# Patient Record
Sex: Male | Born: 2000 | Race: White | Hispanic: No | Marital: Single | Smoking: Never smoker
Health system: Southern US, Community
[De-identification: ages and names within clinical notes are randomized; demographics above are authoritative.]

## PROBLEM LIST (undated history)

## (undated) DIAGNOSIS — T783XXA Angioneurotic edema, initial encounter: Secondary | ICD-10-CM

## (undated) DIAGNOSIS — I880 Nonspecific mesenteric lymphadenitis: Secondary | ICD-10-CM

## (undated) DIAGNOSIS — J45909 Unspecified asthma, uncomplicated: Secondary | ICD-10-CM

## (undated) DIAGNOSIS — Z8489 Family history of other specified conditions: Secondary | ICD-10-CM

## (undated) DIAGNOSIS — F909 Attention-deficit hyperactivity disorder, unspecified type: Secondary | ICD-10-CM

## (undated) DIAGNOSIS — J302 Other seasonal allergic rhinitis: Secondary | ICD-10-CM

## (undated) DIAGNOSIS — L509 Urticaria, unspecified: Secondary | ICD-10-CM

## (undated) HISTORY — DX: Urticaria, unspecified: L50.9

## (undated) HISTORY — DX: Nonspecific mesenteric lymphadenitis: I88.0

## (undated) HISTORY — DX: Angioneurotic edema, initial encounter: T78.3XXA

## (undated) HISTORY — PX: NO PAST SURGERIES: SHX2092

---
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
## 2020-01-14 ENCOUNTER — Telehealth: Payer: Self-pay

## 2020-01-14 NOTE — Telephone Encounter (Signed)
[facility] immunization report printed and patient aware it is at front desk to pick up.

## 2020-01-14 NOTE — Telephone Encounter (Signed)
Patient needs immunization records for college. Orientation is on 01/17/20 so he will need them before Thursday. Please call when ready for pick up.

## 2020-01-29 DIAGNOSIS — J329 Chronic sinusitis, unspecified: Secondary | ICD-10-CM | POA: Insufficient documentation

## 2020-02-06 MED ORDER — ALBUTEROL SULFATE HFA 108 (90 BASE) MCG/ACT IN AERS: 2.0000 | INHALATION_SPRAY | Freq: Four times a day (QID) | RESPIRATORY_TRACT | 1 refills | Status: DC | PRN

## 2020-02-06 NOTE — Addendum Note (Signed)
Addended by: Alfonse Spruce on: 02/06/2020 02:41 PM   Modules accepted: Orders

## 2020-02-06 NOTE — Telephone Encounter (Signed)
Albuterol refill sent in.   Malachi Bonds, MD [facility]

## 2020-02-26 ENCOUNTER — Telehealth: Payer: Self-pay | Admitting: Allergy & Immunology

## 2020-02-26 MED ORDER — AMOXICILLIN 875 MG PO TABS: 875.0000 mg | ORAL_TABLET | Freq: Two times a day (BID) | ORAL | 0 refills | Status: AC

## 2020-02-26 NOTE — Telephone Encounter (Signed)
Patient's mother called to report that he was having 4 to 6 days of worsening bilateral sinus pressure with purulent discharge.  He has been using Sudafed with minimal relief.  He actually is using his nasal sprays as well, which is bizarre for him.  Regardless, she is wondering whether he needs any antibiotics called in.  He is away at college.  I will go ahead and send in a course of amoxicillin for him.  Mom is also considering getting allergy shots started.  Malachi Bonds, MD [facility]

## 2020-04-23 ENCOUNTER — Encounter: Payer: Self-pay | Admitting: Family Medicine

## 2020-04-23 ENCOUNTER — Other Ambulatory Visit: Payer: Self-pay

## 2020-04-23 ENCOUNTER — Telehealth (INDEPENDENT_AMBULATORY_CARE_PROVIDER_SITE_OTHER): Payer: 59 | Admitting: Family Medicine

## 2020-04-23 VITALS — Temp 98.3°F

## 2020-04-23 DIAGNOSIS — J029 Acute pharyngitis, unspecified: Secondary | ICD-10-CM | POA: Diagnosis not present

## 2020-04-23 MED ORDER — AMOXICILLIN-POT CLAVULANATE 875-125 MG PO TABS: 1.0000 | ORAL_TABLET | Freq: Two times a day (BID) | ORAL | 0 refills | Status: DC

## 2020-04-23 NOTE — Progress Notes (Signed)
Virtual Visit via Video Note  I connected with pt on 04/23/20 at  4:00 PM EDT by a video enabled telemedicine application and verified that I am speaking with the correct person using two identifiers.  Location patient: home Location provider:work or home office Persons participating in the virtual visit: patient, provider  I discussed the limitations of evaluation and management by telemedicine and the availability of in person appointments. The patient expressed understanding and agreed to proceed.  Telemedicine visit is a necessity given the COVID-19 restrictions in place at the current time.  HPI: 19 y/o WM with hx of allergic rhinitis and asthma who is being seen today (accompanied by his mom) for sore throat and headache. Onset 2 d/a, progressive ST, very tired.  +HA. No fever. No runny nose but some stuffy nose but this is not unusual for him, no cough, no SOB, no wheezing. Sore to palpation on neck but no nodules are felt by pt palpation today. Some nausea.  Advil since last night, unclear if helping.  No abd pain, diarrhea,or rash.  Covid 19 vaccine status: UTD. Flu : not yet.  ROS: See pertinent positives and negatives per HPI.  Past Medical History:  Diagnosis Date  . ADHD (attention deficit hyperactivity disorder)   . Angio-edema   . Asthma   . Family history of adverse reaction to anesthesia    mom had am asthma flare up after waking up.   . Fetomaternal hemorrhage 2000-11-05   41 wks term, fetomaternal hemmorhage s/p transfusion in NICU  . Mesenteric adenitis   . Mononucleosis 03/2018  . Seasonal allergies   . Urticaria     Past Surgical History:  Procedure Laterality Date  . CHOLECYSTECTOMY N/A 07/12/2019   Procedure: LAPAROSCOPIC CHOLECYSTECTOMY WITH ATTEMPTED INTRAOPERATIVE CHOLANGIOGRAM;  Surgeon: [name], MD;  Location: [facility] OR;  Service: General;  Laterality: N/A;     Current Outpatient Medications:  .  albuterol (VENTOLIN HFA) 108 (90 Base)  MCG/ACT inhaler, Inhale 2 puffs into the lungs every 6 (six) hours as needed for wheezing or shortness of breath., Disp: 18 g, Rfl: 1 .  EPINEPHrine (AUVI-Q) 0.3 mg/0.3 mL IJ SOAJ injection, Inject 0.3 mLs (0.3 mg total) into the muscle as needed for anaphylaxis., Disp: 2 Device, Rfl: 2 .  Fluticasone Propionate (XHANCE) 93 MCG/ACT EXHU, Place 2 sprays into the nose 2 (two) times daily as needed., Disp: 32 mL, Rfl: 5 .  Melatonin 3 MG CAPS, Take 3 mg by mouth at bedtime as needed (sleep)., Disp: , Rfl:  .  Olopatadine HCl (PATADAY) 0.2 % SOLN, Place 1 drop into both eyes daily as needed., Disp: 2.5 mL, Rfl: 5 .  pantoprazole (PROTONIX) 20 MG tablet, Take 2 tablets (40 mg total) by mouth daily for 14 days., Disp: 28 tablet, Rfl: 0 .  predniSONE (DELTASONE) 10 MG tablet, Take two tablets (20mg ) twice daily for three days, then one tablet (10mg ) twice daily for three days, then STOP., Disp: 18 tablet, Rfl: 0 .  sertraline (ZOLOFT) 50 MG tablet, Take 1.5 tablets (75 mg total) by mouth daily., Disp: 135 tablet, Rfl: 1 .  Timothy Grass Pollen Allergen (GRASTEK) 2800 BAU SUBL, Place 1 tablet under the tongue daily. (Patient taking differently: Place 1 tablet under the tongue daily as needed. ), Disp: 30 tablet, Rfl: 2  EXAM:  VITALS per patient if applicable:  Vitals with BMI 10/09/2019 09/24/2019 07/16/2019  Height - 5' 9.75" 5' 9.75"  Weight - - 133 lbs 4 oz  BMI - -  19.25  Systolic (No Data) - 111  Diastolic (No Data) - 74  Pulse - - 82   GENERAL: alert, oriented, appears well and in no acute distress  HEENT: atraumatic, conjunttiva clear, no obvious abnormalities on inspection of external nose and ears  NECK: normal movements of the head and neck  LUNGS: on inspection no signs of respiratory distress, breathing rate appears normal, no obvious gross SOB, gasping or wheezing  CV: no obvious cyanosis  MS: moves all visible extremities without noticeable abnormality  PSYCH/NEURO: pleasant and  cooperative, no obvious depression or anxiety, speech and thought processing grossly intact  ASSESSMENT AND PLAN:  Discussed the following assessment and plan:  Acute pharyngitis, strep possible.  Also consider flu and/or covid, as well as other common respiratory viruses. I'll rx augmentin 875-125, 1 bid x 10d. Check strep/flu/covid swabs tomorrow afternoon at hour office. Cont advil 600 mg tid with food. Push fluids, rest. Signs/symptoms to call or return for were reviewed and pt expressed understanding.  -we discussed possible serious and likely etiologies, options for evaluation and workup, limitations of telemedicine visit vs in person visit, treatment, treatment risks and precautions. Pt prefers to treat via telemedicine empirically rather than in person at this moment.    I discussed the assessment and treatment plan with the patient. The patient was provided an opportunity to ask questions and all were answered. The patient agreed with the plan and demonstrated an understanding of the instructions.   Signed:  Santiago Bumpers, MD           04/23/2020

## 2020-04-24 ENCOUNTER — Encounter: Payer: Self-pay | Admitting: Family Medicine

## 2020-04-24 ENCOUNTER — Ambulatory Visit (INDEPENDENT_AMBULATORY_CARE_PROVIDER_SITE_OTHER): Payer: 59

## 2020-04-24 DIAGNOSIS — R6889 Other general symptoms and signs: Secondary | ICD-10-CM

## 2020-04-24 LAB — POCT INFLUENZA A/B
Influenza A, POC: NEGATIVE
Influenza B, POC: NEGATIVE

## 2020-04-24 LAB — POCT RAPID STREP A (OFFICE): Rapid Strep A Screen: NEGATIVE

## 2020-04-24 MED ORDER — PROMETHAZINE HCL 12.5 MG PO TABS: ORAL_TABLET | ORAL | 0 refills | Status: DC

## 2020-04-24 NOTE — Telephone Encounter (Signed)
OK, will eRx phenergan.  If not responding to this med this evening and pt continues to worsen then needs to be seen in ED. Also can put on my schedule tomorrow at 11:30 now if he/mom wants.

## 2020-04-26 LAB — SARS-COV-2, NAA 2 DAY TAT

## 2020-04-26 LAB — NOVEL CORONAVIRUS, NAA: SARS-CoV-2, NAA: NOT DETECTED

## 2020-04-28 ENCOUNTER — Telehealth: Payer: Self-pay | Admitting: Family Medicine

## 2020-04-28 NOTE — Telephone Encounter (Signed)
Patient's mother called back regarding followup testing. She will be available to take a call before 1150 this morning or after 2pm this afternoon.

## 2020-04-28 NOTE — Telephone Encounter (Signed)
Patient's mother advised regarding results.

## 2020-05-25 ENCOUNTER — Telehealth: Payer: Self-pay | Admitting: Allergy & Immunology

## 2020-05-25 MED ORDER — ALBUTEROL SULFATE HFA 108 (90 BASE) MCG/ACT IN AERS: 2.0000 | INHALATION_SPRAY | Freq: Four times a day (QID) | RESPIRATORY_TRACT | 1 refills | Status: DC | PRN

## 2020-05-25 NOTE — Telephone Encounter (Signed)
Patient's mother called requesting albuterol be sent to where his dad lives [place], [place]) since [place] left his albuterol inhaler in [place] with his mother. Confirmed pharmacy and sent in the prescription.   Malachi Bonds, MD [facility]

## 2020-06-11 ENCOUNTER — Other Ambulatory Visit: Payer: Self-pay

## 2020-06-11 MED ORDER — SERTRALINE HCL 50 MG PO TABS: 75.0000 mg | ORAL_TABLET | Freq: Every day | ORAL | 0 refills | Status: DC

## 2020-07-03 ENCOUNTER — Other Ambulatory Visit: Payer: Self-pay | Admitting: Family Medicine

## 2020-07-15 ENCOUNTER — Ambulatory Visit: Payer: 59 | Admitting: Family Medicine

## 2020-07-15 ENCOUNTER — Other Ambulatory Visit: Payer: Self-pay

## 2020-07-16 ENCOUNTER — Ambulatory Visit: Payer: 59 | Admitting: Family Medicine

## 2020-07-16 ENCOUNTER — Encounter: Payer: Self-pay | Admitting: Family Medicine

## 2020-07-16 VITALS — BP 108/61 | HR 75 | Temp 97.3°F | Ht 69.75 in | Wt 168.0 lb

## 2020-07-16 DIAGNOSIS — F418 Other specified anxiety disorders: Secondary | ICD-10-CM | POA: Diagnosis not present

## 2020-07-16 MED ORDER — SERTRALINE HCL 50 MG PO TABS: 75.0000 mg | ORAL_TABLET | Freq: Every day | ORAL | 1 refills | Status: DC

## 2020-07-16 NOTE — Progress Notes (Signed)
Patient ID: Adam Odonnell, male  DOB: [birth year], 20 y.o.   MRN: [id number] Patient Care Team    Relationship Specialty Notifications Start End  Ma Hillock, DO PCP - General Family Medicine  07/16/19   Valentina Shaggy, MD Consulting Physician Allergy and Immunology  07/16/19   Arbutus Leas, PhD Consulting Physician Psychology  07/16/19     Chief Complaint  Patient presents with  . Follow-up    [facility]    Subjective: Adam Odonnell is a 20 y.o.  male present for follow-up on depression and anxiety. Depression/anxiety: Patient reports compliance Zoloft 75 mg daily.  He feels its working well for him. It was increased last visit.  He does not want to go up on his medication too terribly much.  He is no longer seeing his therapist.  He left college and moved back home.  He did not feel the college was a good fit for him.  He is currently working at [employer] part-time and is planning on starting [employer]. Prior note: Patient presents with his mother today to discuss feelings of depression and anxiety that started approximately 3 months ago.  Mother states his symptoms started when he was positive for Covid.  She reports he had to stay in his room and isolate, since that time he has started to experience depression and anxiety.  He states he has lack of motivation for all tasks including schoolwork.  He is either sleeping too much and then not sleeping well.  He is not eating well.  He is having trouble concentrating.  He has noticed an increase in his worrying.  There is a family history of depression and anxiety.  Mother reports he had a full work-up at his pediatrics office that included a TSH just a few weeks ago and it was normal.  Depression screen [facility] 2/9 07/16/2020 09/24/2019 07/16/2019  Decreased Interest 1 0 3  Down, Depressed, Hopeless 1 0 3  PHQ - 2 Score 2 0 6  Altered sleeping '1 1 3  ' Tired, decreased energy '1 1 2  ' Change in appetite 0 0 3  Feeling bad or  failure about yourself  0 0 1  Trouble concentrating 0 1 3  Moving slowly or fidgety/restless 0 0 0  Suicidal thoughts 0 0 0  PHQ-9 Score '4 3 18  ' Difficult doing work/chores - Somewhat difficult Not difficult at all   GAD 7 : Generalized Anxiety Score 07/16/2020 09/24/2019 07/16/2019  Nervous, Anxious, on Edge '1 1 2  ' Control/stop worrying 0 1 2  Worry too much - different things '1 3 3  ' Trouble relaxing '1 1 3  ' Restless 0 0 0  Easily annoyed or irritable '1 3 3  ' Afraid - awful might happen 0 0 0  Total GAD 7 Score '4 9 13  ' Anxiety Difficulty - Very difficult Somewhat difficult       No flowsheet data found.   Immunization History  Administered Date(s) Administered  . DTaP 01/23/2001, 04/06/2001, 06/05/2001, 02/28/2002, 12/07/2005  . HPV Quadrivalent 12/24/2011, 09/27/2012, 02/13/2013  . Hepatitis A 12/07/2005, 11/18/2006  . Hepatitis B [birth year]01, 12/19/2000, 09/18/2001  . HiB (PRP-OMP) 01/23/2001, 04/06/2001, 06/05/2001, 02/28/2002  . IPV 01/23/2001, 04/06/2001, 11/22/2001, 12/07/2005  . Influenza,inj,Quad PF,6+ Mos 04/30/2019  . MMR 11/22/2001, 12/07/2005  . Meningococcal Conjugate 12/24/2011  . Pneumococcal Conjugate-13 01/23/2001, 04/06/2001, 06/05/2001, 02/28/2002  . Tdap 12/24/2011  . Varicella 11/22/2001, 12/07/2005    No exam data present  Past Medical History:  Diagnosis Date  . ADHD (attention deficit hyperactivity disorder)   . Angio-edema   . Asthma   . Family history of adverse reaction to anesthesia    mom had am asthma flare up after waking up.   . Fetomaternal hemorrhage 2001-05-08   41 wks term, fetomaternal hemmorhage s/p transfusion in NICU  . Mesenteric adenitis   . Mononucleosis 03/2018  . Seasonal allergies   . Urticaria    No Known Allergies Past Surgical History:  Procedure Laterality Date  . CHOLECYSTECTOMY N/A 07/12/2019   Procedure: LAPAROSCOPIC CHOLECYSTECTOMY WITH ATTEMPTED INTRAOPERATIVE CHOLANGIOGRAM;  Surgeon: Coralie Keens, MD;   Location: [facility] OR;  Service: General;  Laterality: N/A;   Family History  Problem Relation Age of Onset  . Allergic rhinitis Mother   . Asthma Mother   . Cancer Mother   . Depression Mother   . Alcohol abuse Father   . Asthma Maternal Grandmother   . Depression Maternal Grandmother   . Hypertension Maternal Grandmother   . Heart disease Maternal Grandfather   . Colon cancer Paternal Grandmother   . Alcohol abuse Paternal Grandfather   . 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    Safety:      -smoke alarm in the home:Yes     - wears seatbelt: Yes     - Feels safe in their relationships: Yes       All past medical history, surgical history, allergies, family history, immunizations andmedications were updated in the EMR today and reviewed under the history and medication portions of their EMR.     No results found.   ROS: 14 pt review of systems performed and negative (unless mentioned in an HPI)  Objective: BP 108/61   Pulse 75   Temp (!) 97.3 F (36.3 C) (Oral)   Ht 5' 9.75" (1.772 m)   Wt 168 lb (76.2 kg)   SpO2 96%   BMI 24.28 kg/m  Gen: Afebrile. No acute distress.  HENT: AT. [place].  Eyes:Pupils Equal Round Reactive to light, Extraocular movements intact,  Conjunctiva without redness, discharge or icterus. Neuro: Normal gait. PERLA. EOMi. Alert. Oriented x3 Psych: Normal affect, dress and demeanor. Normal speech. Normal thought content and judgment.  Assessment/plan: Adam Odonnell is a 20 y.o. male present for est care Depression with anxiety Stable.  - continue  Zoloft  75 mg daily. Discussed staying motivated.  Maintaining routine exercise. No longer attending therapy sessions.  Mom has some concerns he is sleeping more and is unmotivated. Follow-up in 5.5 mos sooner if needed   No orders of the defined types were placed in this  encounter.  Meds ordered this encounter  Medications  . sertraline (ZOLOFT) 50 MG tablet    Sig: Take 1.5 tablets (75 mg total) by mouth daily.    Dispense:  135 tablet    Refill:  1     Note is dictated utilizing voice recognition software. Although note has been proof read prior to signing, occasional typographical errors still can be missed. If any questions arise, please do not hesitate to call for verification.  Electronically signed by: Howard Pouch, DO [place]

## 2020-07-16 NOTE — Patient Instructions (Signed)
Nice to see you today.  I have refilled your medications for you.  Next appt first week in July- unless needed sooner.     Managing Depression, Teen Depression is a mental health condition that can affect your thoughts, feelings, and behavior. You may feel down, blue, or sad, or you may be irritable and moody. If you have been diagnosed with depression, you may be relieved to know now why you have felt or behaved a certain way. If you are living with depression, there are ways to help you relieve the symptoms and feel better. How to manage lifestyle changes Managing stress Stress is your body's reaction to life's demands. You can have stress from good things, such as a vacation, or difficult things, such as a hard test. Stress that lasts a long time can play a part in depression, so it is important to learn how to manage stress. Try some of the following approaches for reducing your tension and helping to manage stress (stress reduction techniques):  If you play an instrument, take some time to play it, or listen to music that helps you feel calm.  Try using a meditation app.  Do some deep breathing. To do this, inhale slowly through your nose. Pause at the top of your inhale for a few seconds and then exhale slowly, letting yourself relax. Repeat this three or four times. There are several other things you can do to help you manage depression, such as:  Spending time in nature.  Spending time with trusted friends who help you feel better.  Taking time to think about the positive things in your life.  Exercising, such as playing an active game with friends or going for a run or a bike ride.  Spending less time using electronics, especially at night before bed. Using electronic screens before bed makes your brain think it is time to get up rather than go to bed.  Limit how much time you watch TV or play video games. These activities might feel good for a while, but in the end, they are a  way to avoid the feelings of depression. Medicines Antidepressants are often prescribed by a health care provider to ease the symptoms of depression. When used together, medicines, psychotherapy, and stress reduction techniques are often the most effective treatment. Medicines take time to work. You may not notice the full benefits of your medicine for 4-8 weeks.  Do not stop taking your medicine. Talk to your health care provider and have a plan to lower your dose safely. Relationships Relationships are important to people throughout their lives. Friends and family can be great resources to help you deal with the difficult feelings you get from depression. Talk to family and friends when things are becoming difficult. You may also want to talk with a therapist. A relationship with a therapist may be very important to helping you manage your depression.   How to recognize changes Everyone responds differently to treatment for depression. Recovery from depression happens when your symptoms have gone away and you may:  Have more interest in doing activities.  Feel hopeful again.  Have more energy.  Have fewer problems with eating too much or too little food.  Have better mental focus. If you find your depression does not change, you may still:  Have problems sleeping or waking, feel tired all the time, or have trouble focusing.  Have changes in your appetite. You may lose or gain weight without trying.  Have constant headaches  or stomachaches.  Want to be alone or avoid interacting with others.  Lack interest in doing the things you usually like to do.  Feel angry or irritated most of the time.  Think about death, or consider suicide.  Use alcohol, drugs, or tobacco or nicotine products. Follow these instructions at home: Activity  Spend time with trusted friends who help you feel better.  Get some form of activity each day, such as walking, biking, or any movement activity  you enjoy.  Practice self-calming and other stress reduction techniques.   Lifestyle  Get the right amount and quality of sleep.  Do not use drugs. Do not drink alcohol.  Eat a healthy diet that includes plenty of vegetables, fruits, whole grains, low-fat dairy products, and lean protein. Do not eat a lot of foods that are high in solid fats, added sugars, or salt (sodium). General instructions  Take over-the-counter and prescription medicines only as told by your health care provider. Tell your health care provider about the positive and negative effects you are having from your medicines.  Keep all follow-up visits as told by your health care provider. This is important. Where to find support Talking to others Although depression is serious, support is available. Resources may include:  Suicide prevention, crisis prevention, and depression hotlines.  School teachers, counselors, Systems developer, or clergy.  Parents or other family members.  Trusted friends.  Support groups. Therapy and support groups You can locate a counselor or support group from one of these sources:  Anxiety and Depression Association of America (ADAA): www.adaa.org  Mental Health America: www.mentalhealthamerica.net  [employer] on Mental Illness (NAMI): www.nami.org Contact a health care provider if:  You stop taking your antidepressant medicines, and you have any of these symptoms: ? Nausea. ? Headache. ? Light-headedness. ? Chills and body aches. ? Not being able to sleep (insomnia).  You or your friends and family think your depression is getting worse. Get help right away if:  You feel suicidal and are planning suicide.  You are drinking or using drugs excessively.  You are cutting yourself or thinking about it.  You are thinking about hurting others and are making a plan to do so. If you ever feel like you may hurt yourself or others, or have thoughts about taking your own life, get  help right away. Go to your nearest emergency department or:  Call your local emergency services (911 in the [place]  Call a suicide crisis helpline, such as the National Suicide Prevention Lifeline at (917)679-8343. This is open 24 hours a day in the [place]  Text the Crisis Text Line at 757 206 8866 (in the [place] Summary  There are ways to help you relieve your symptoms of depression.  Work with your health care provider on a care plan that includes stress reduction techniques, medicines (if applicable), therapy, and healthy lifestyle habits.  A relationship with a therapist may be very important to helping you manage your depression.  If you have thoughts about taking your own life, call a suicide crisis helpline or text a crisis text line. This information is not intended to replace advice given to you by your health care provider. Make sure you discuss any questions you have with your health care provider. Document Revised: 04/25/2019 Document Reviewed: 04/25/2019 Elsevier Patient Education  2021 [employer].

## 2020-11-26 ENCOUNTER — Ambulatory Visit: Payer: 59 | Admitting: Family Medicine

## 2020-12-09 ENCOUNTER — Ambulatory Visit (INDEPENDENT_AMBULATORY_CARE_PROVIDER_SITE_OTHER): Payer: 59 | Admitting: Family Medicine

## 2020-12-09 ENCOUNTER — Other Ambulatory Visit: Payer: Self-pay

## 2020-12-09 ENCOUNTER — Encounter: Payer: Self-pay | Admitting: Family Medicine

## 2020-12-09 VITALS — BP 118/75 | HR 58 | Temp 98.4°F | Ht 70.0 in | Wt 136.0 lb

## 2020-12-09 DIAGNOSIS — F418 Other specified anxiety disorders: Secondary | ICD-10-CM | POA: Diagnosis not present

## 2020-12-09 DIAGNOSIS — K8020 Calculus of gallbladder without cholecystitis without obstruction: Secondary | ICD-10-CM | POA: Insufficient documentation

## 2020-12-09 MED ORDER — SERTRALINE HCL 100 MG PO TABS: 100.0000 mg | ORAL_TABLET | Freq: Every day | ORAL | 1 refills | Status: DC

## 2020-12-09 NOTE — Progress Notes (Signed)
Patient ID: Adam Odonnell, male  DOB: 2000-10-24, 20 y.o.   MRN: [id number] Patient Care Team    Relationship Specialty Notifications Start End  Ma Hillock, DO PCP - General Family Medicine  07/16/19   Valentina Shaggy, MD Consulting Physician Allergy and Immunology  07/16/19   Arbutus Leas, PhD Consulting Physician Psychology  07/16/19     Chief Complaint  Patient presents with   Anxiety    Subjective: Adam Odonnell is a 20 y.o.  male present for follow-up on depression and anxiety. Depression/anxiety: Patient reports compliance Zoloft 75 mg daily.  He feels this medication works okay for him but he has noticed an increase in anxiety and thinks he could use a higher dose.  He left college and moved back home.  He did not feel the college was a good fit for him.  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 Prior note: Patient presents with his mother today to discuss feelings of depression and anxiety that started approximately 3 months ago.  Mother states his symptoms started when he was positive for Covid.  She reports he had to stay in his room and isolate, since that time he has started to experience depression and anxiety.  He states he has lack of motivation for all tasks including schoolwork.  He is either sleeping too much and then not sleeping well.  He is not eating well.  He is having trouble concentrating.  He has noticed an increase in his worrying.  There is a family history of depression and anxiety.  Mother reports he had a full work-up at his pediatrics office that included a TSH just a few weeks ago and it was normal.  Depression screen [facility] 2/9 12/09/2020 07/16/2020 09/24/2019 07/16/2019  Decreased Interest 1 1 0 3  Down, Depressed, Hopeless 1 1 0 3  PHQ - 2 Score 2 2 0 6  Altered sleeping '1 1 1 3  ' Tired, decreased energy '1 1 1 2  ' Change in appetite 2 0 0 3  Feeling bad or failure about yourself   1 0 0 1  Trouble concentrating 1 0 1 3  Moving slowly or fidgety/restless 1 0 0 0  Suicidal thoughts 0 0 0 0  PHQ-9 Score '9 4 3 18  ' Difficult doing work/chores - - Somewhat difficult Not difficult at all   GAD 7 : Generalized Anxiety Score 12/09/2020 07/16/2020 09/24/2019 07/16/2019  Nervous, Anxious, on Edge '2 1 1 2  ' Control/stop worrying 1 0 1 2  Worry too much - different things '1 1 3 3  ' Trouble relaxing '1 1 1 3  ' Restless 1 0 0 0  Easily annoyed or irritable '2 1 3 3  ' Afraid - awful might happen 0 0 0 0  Total GAD 7 Score '8 4 9 13  ' Anxiety Difficulty - - Very difficult Somewhat difficult       No flowsheet data found.   Immunization History  Administered Date(s) Administered   DTaP 01/23/2001, 04/06/2001, 06/05/2001, 02/28/2002, 12/07/2005   HPV Quadrivalent 12/24/2011, 09/27/2012, 02/13/2013   Hepatitis A 12/07/2005, 11/18/2006   Hepatitis B Dec 27, 2000, 12/19/2000, 09/18/2001   HiB (PRP-OMP) 01/23/2001, 04/06/2001, 06/05/2001, 02/28/2002   IPV 01/23/2001, 04/06/2001, 11/22/2001, 12/07/2005   Influenza,inj,Quad PF,6+ Mos 04/30/2019   MMR 11/22/2001, 12/07/2005   Meningococcal Conjugate 12/24/2011   PFIZER Comirnaty(Gray Top)Covid-19 Tri-Sucrose Vaccine 12/26/2019, 01/26/2020   Pneumococcal Conjugate-13 01/23/2001, 04/06/2001, 06/05/2001, 02/28/2002  Tdap 12/24/2011   Varicella 11/22/2001, 12/07/2005    No results found.  Past Medical History:  Diagnosis Date   ADHD (attention deficit hyperactivity disorder)    Angio-edema    Asthma    Family history of adverse reaction to anesthesia    mom had am asthma flare up after waking up.    Fetomaternal hemorrhage 24-Jul-2000   41 wks term, fetomaternal hemmorhage s/p transfusion in NICU   Mesenteric adenitis    Mononucleosis 03/2018   Seasonal allergies    Urticaria    No Known Allergies Past Surgical History:  Procedure Laterality Date   CHOLECYSTECTOMY N/A 07/12/2019   Procedure: LAPAROSCOPIC CHOLECYSTECTOMY WITH  ATTEMPTED INTRAOPERATIVE CHOLANGIOGRAM;  Surgeon: Coralie Keens, MD;  Location: [place];  Service: General;  Laterality: N/A;   Family History  Problem Relation Age of Onset   Allergic rhinitis Mother    Asthma Mother    Cancer Mother    Depression Mother    Alcohol abuse Father    Asthma Maternal Grandmother    Depression Maternal Grandmother    Hypertension Maternal Grandmother    Heart disease Maternal Grandfather    Colon cancer Paternal Grandmother    Alcohol abuse Paternal Grandfather    Pancreatitis Maternal Uncle        sudden death at 72   Social History   Social History Narrative   Marital status/children/pets: Single, HS student.    Safety:      -smoke alarm in the home:Yes     - wears seatbelt: Yes     - Feels safe in their relationships: Yes       All past medical history, surgical history, allergies, family history, immunizations andmedications were updated in the EMR today and reviewed under the history and medication portions of their EMR.     No results found.   ROS: 14 pt review of systems performed and negative (unless mentioned in an HPI)  Objective: BP 118/75   Pulse (!) 58   Temp 98.4 F (36.9 C) (Oral)   Ht '5\' 10"'  (1.778 m)   Wt 136 lb (61.7 kg)   SpO2 96%   BMI 19.51 kg/m  Gen: Afebrile. No acute distress.  HENT: AT. [place].  Eyes:Pupils Equal Round Reactive to light, Extraocular movements intact,  Conjunctiva without redness, discharge or icterus. Neuro:Normal gait. PERLA. EOMi. Alert. Oriented. x3 Psych: Normal affect, dress and demeanor. Normal speech. Normal thought content and judgment.  Assessment/plan: Adam Odonnell is a 20 y.o. male present for est care Depression with anxiety Stable, could use some additional coverage. Increase Zoloft to 100 mg daily Follow-up in 5.5 mos sooner if needed   No orders of the defined types were placed in this encounter.  Meds ordered this encounter  Medications   sertraline (ZOLOFT) 100 MG  tablet    Sig: Take 1 tablet (100 mg total) by mouth daily.    Dispense:  90 tablet    Refill:  1      Note is dictated utilizing voice recognition software. Although note has been proof read prior to signing, occasional typographical errors still can be missed. If any questions arise, please do not hesitate to call for verification.  Electronically signed by: Howard Pouch, DO [place]

## 2020-12-09 NOTE — Patient Instructions (Signed)

## 2020-12-21 ENCOUNTER — Encounter: Payer: Self-pay | Admitting: Family Medicine

## 2020-12-22 NOTE — Telephone Encounter (Signed)
Please advise patient there are some medications that can be taken for anxiety the way he is describing his symptoms as anticipatory anxiety.  Please schedule him for virtual visit at his earliest convenience and we can discuss these options with him.

## 2021-06-13 ENCOUNTER — Other Ambulatory Visit: Payer: Self-pay | Admitting: Family Medicine

## 2021-07-07 ENCOUNTER — Other Ambulatory Visit: Payer: Self-pay | Admitting: Family Medicine

## 2021-07-11 ENCOUNTER — Other Ambulatory Visit: Payer: Self-pay | Admitting: Family Medicine

## 2021-09-06 IMAGING — US US ABDOMEN COMPLETE
1 series · 13 of 25 positions shown · non-contrast
Comparison: None.

CLINICAL DATA: Epigastric region pain

EXAM:
ABDOMEN ULTRASOUND COMPLETE

[Series 1: us abdomen complete · 13 of 85 slices shown]
[im 1/85]
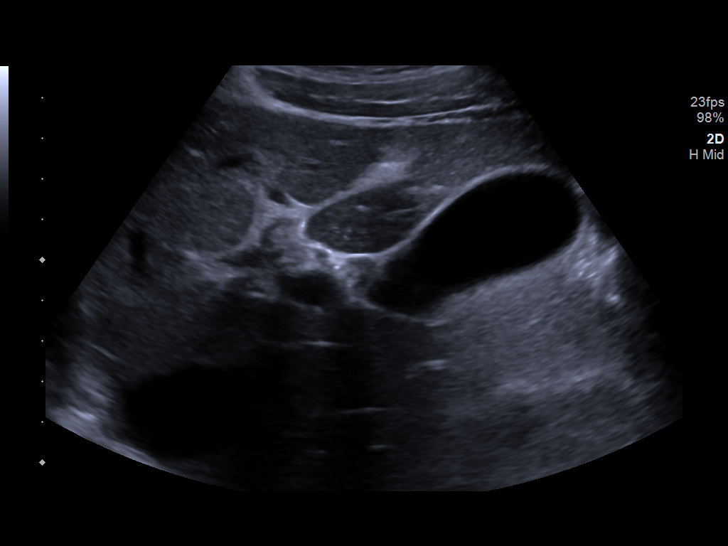
[im 8/85]
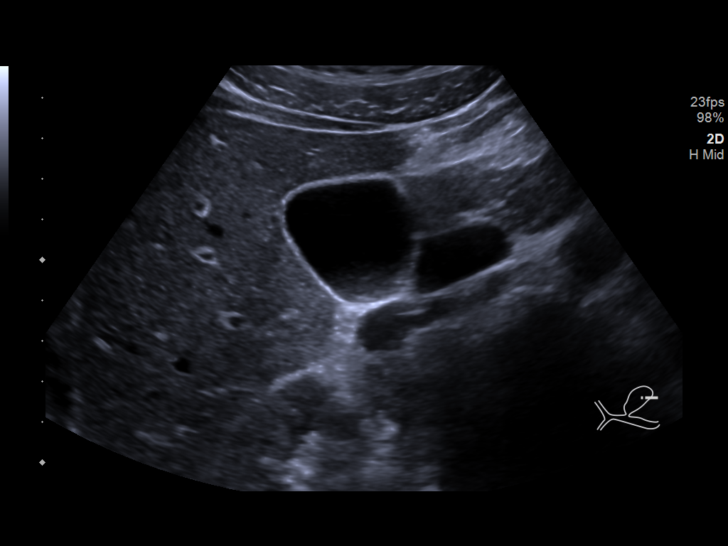
[im 15/85]
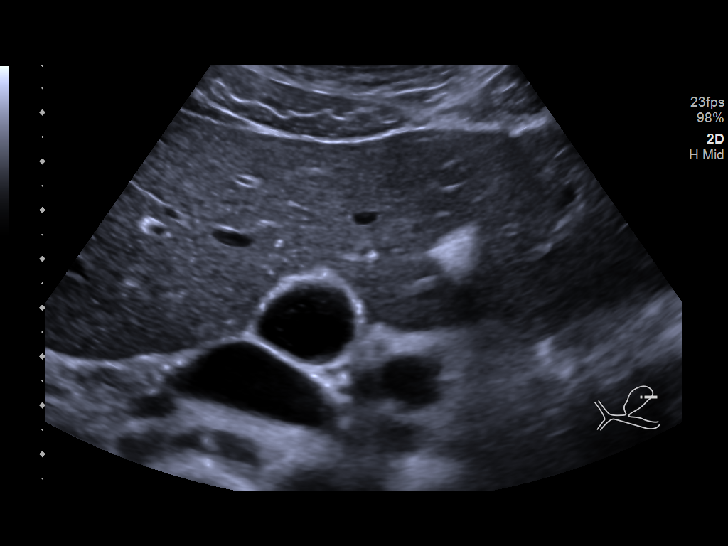
[im 22/85]
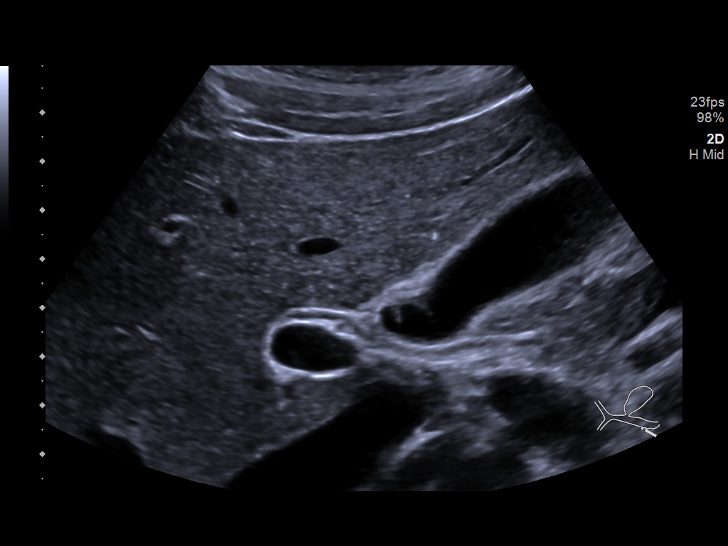
[im 29/85]
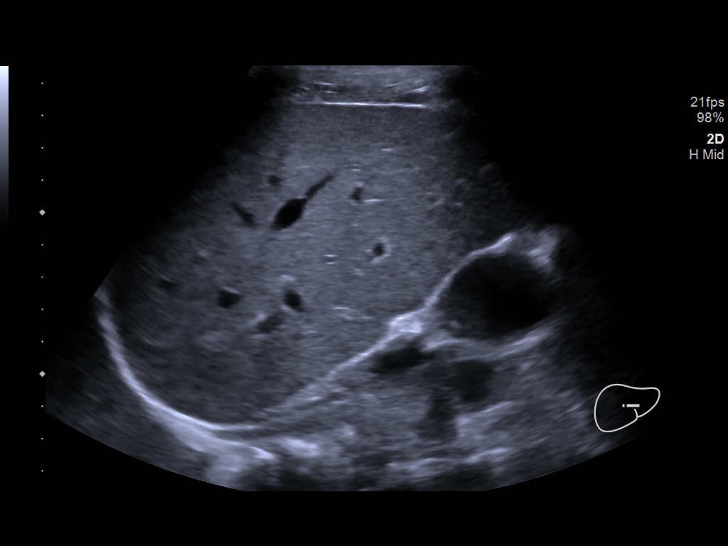
[im 36/85]
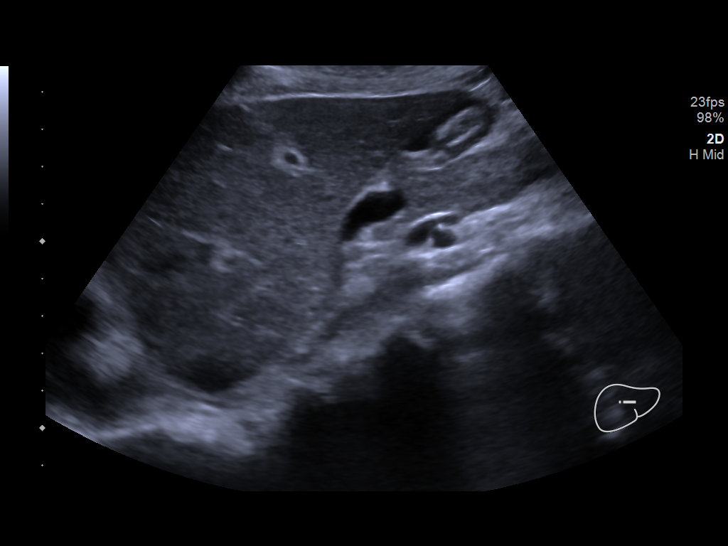
[im 43/85]
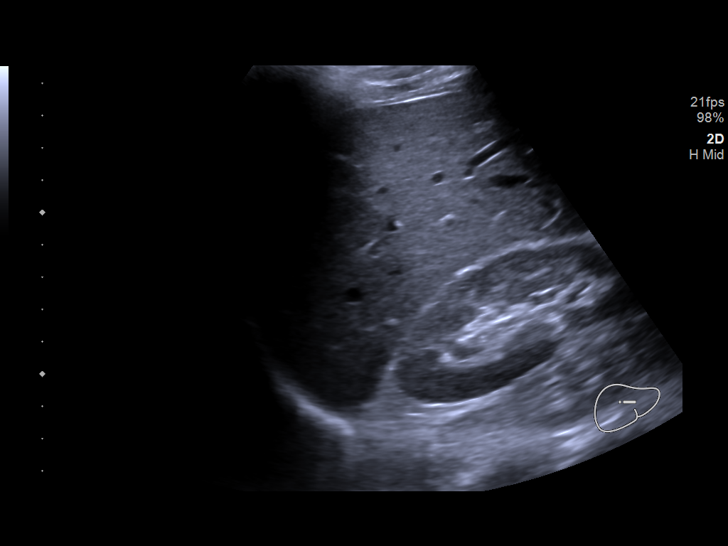
[im 50/85]
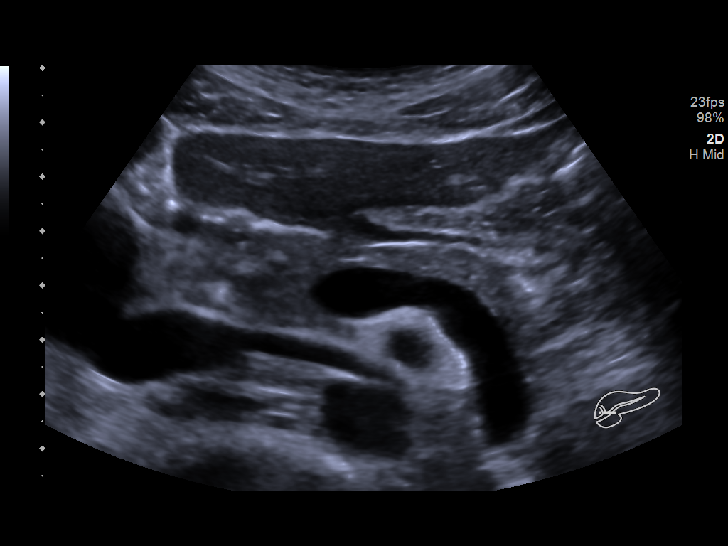
[im 57/85]
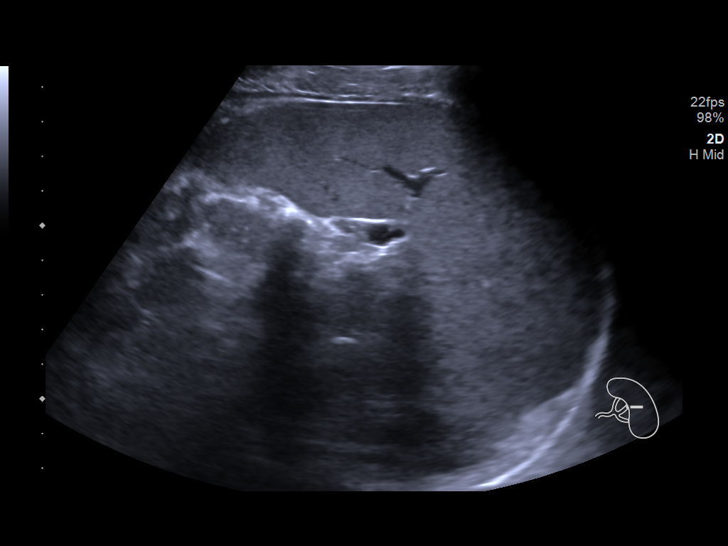
[im 64/85]
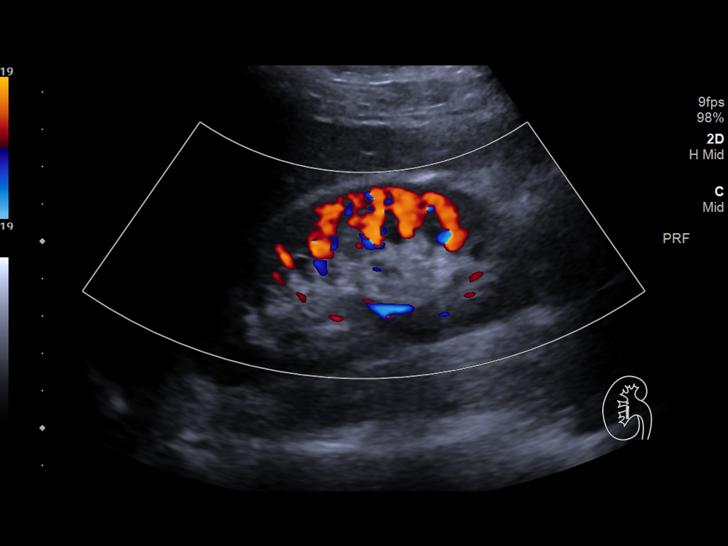
[im 71/85]
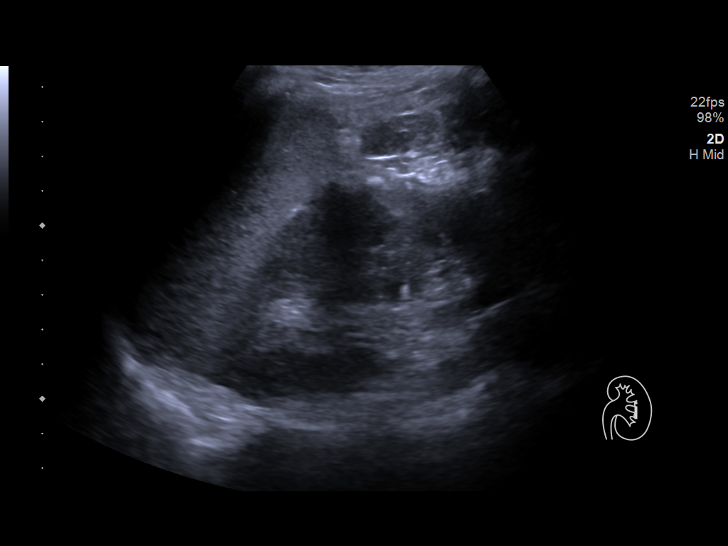
[im 78/85]
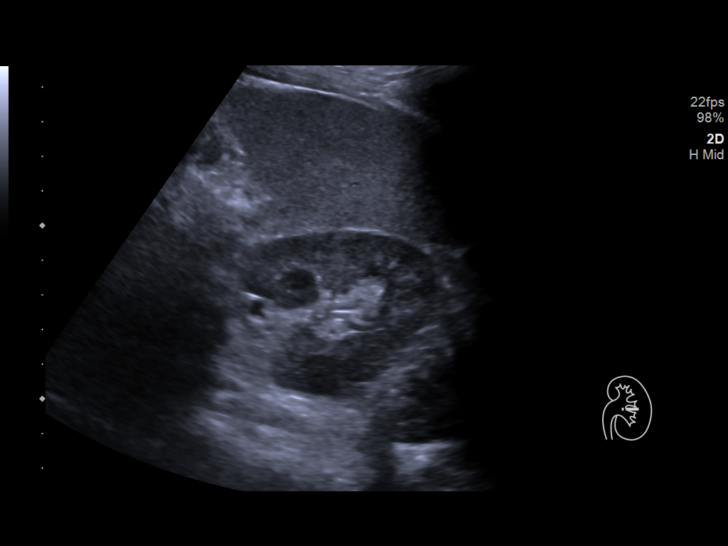
[im 85/85]
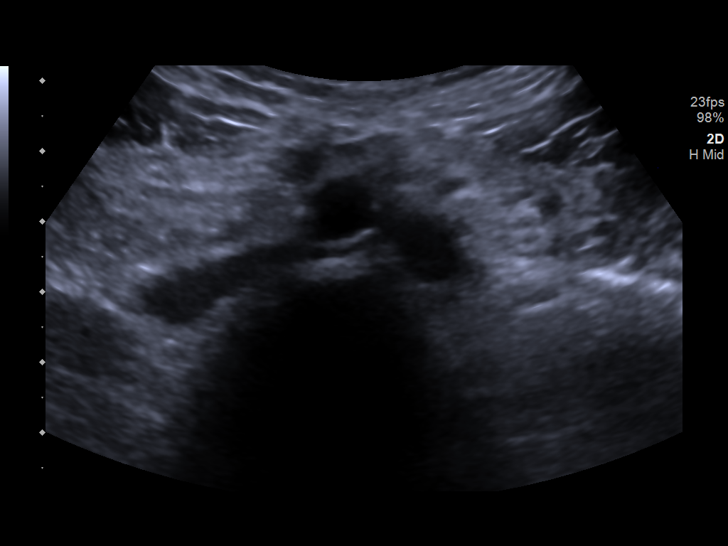

[13 of 25 positions shown; findings below may reference images not displayed]

FINDINGS: Gallbladder: Within the gallbladder, there are small echogenic foci
which move and shadow consistent with cholelithiasis. There is also
sludge in the gallbladder. Largest gallstone measures approximately
3 mm. There is no gallbladder wall thickening or pericholecystic
fluid. No sonographic Murphy sign noted by sonographer.

Common bile duct: Diameter: 2 mm. No intrahepatic or extrahepatic
biliary duct dilatation.

Liver: No focal lesion identified. Within normal limits in
parenchymal echogenicity. Portal vein is patent on color Doppler
imaging with normal direction of blood flow towards the liver.

IVC: No abnormality visualized.

Pancreas: No pancreatic mass or inflammatory focus.

Spleen: Size and appearance within normal limits.

Right Kidney: Length: 10.7 cm. Echogenicity within normal limits. No
mass or hydronephrosis visualized.

Left Kidney: Length: 10.8 cm. Echogenicity within normal limits. No
mass or hydronephrosis visualized.

Abdominal aorta: No aneurysm visualized.

Other findings: No demonstrable ascites.
IMPRESSION: Cholelithiasis with sludge in gallbladder. No gallbladder wall
thickening or pericholecystic fluid.

Study otherwise unremarkable.

## 2021-10-28 ENCOUNTER — Encounter: Payer: Self-pay | Admitting: Family Medicine

## 2021-10-28 ENCOUNTER — Telehealth: Payer: 59 | Admitting: Family Medicine

## 2021-10-28 DIAGNOSIS — F418 Other specified anxiety disorders: Secondary | ICD-10-CM

## 2021-10-28 MED ORDER — SERTRALINE HCL 100 MG PO TABS: 100.0000 mg | ORAL_TABLET | Freq: Every day | ORAL | 1 refills | Status: DC

## 2021-10-28 NOTE — Progress Notes (Signed)
? ? ? ?VIRTUAL VISIT VIA VIDEO ? ?I connected with Adam Odonnell on 10/28/21 at  3:20 PM EDT by a video enabled telemedicine application and verified that I am speaking with the correct person using two identifiers. ?Location patient: Home ?Location provider: [facility], Office ?Persons participating in the virtual visit: Patient, Dr. Raoul Pitch and Cyndra Numbers, [place] ? ?I discussed the limitations of evaluation and management by telemedicine and the availability of in person appointments. The patient expressed understanding and agreed to proceed. ? ? ? ? ? ?Patient ID: Adam Odonnell, male  DOB: 2001-04-03, 21 y.o.   MRN: [id number] ?Patient Care Team  ?  Relationship Specialty Notifications Start End  ?Ma Hillock, DO PCP - General Family Medicine  07/16/19   ?Valentina Shaggy, MD Consulting Physician Allergy and Immunology  07/16/19   ?Arbutus Leas, PhD Consulting Physician Psychology  07/16/19   ? ? ?Chief Complaint  ?Patient presents with  ? Depression  ? ? ?Subjective: ?Adam Odonnell is a 21 y.o.  male present for follow-up on depression and anxiety. ?Depression/anxiety:Adam Odonnell is a 21 y.o. male present for f/u on depression and anxiety. He reports he is working a steady job, and doing well. Feels the zoloft 100 mg is working well for him.  ?Prior note: ?Patient reports compliance Zoloft 75 mg daily.  He feels this medication works okay for him but he has noticed an increase in anxiety and thinks he could use a higher dose.  He left college and moved back home.  He did not feel the college was a good fit for him.  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 ?Prior note: ?Patient presents with his mother today to discuss feelings of depression and anxiety that started approximately 3 months ago.  Mother states his symptoms started when he was positive for Covid.  She reports he had to stay in his room and isolate, since that time he  has started to experience depression and anxiety.  He states he has lack of motivation for all tasks including schoolwork.  He is either sleeping too much and then not sleeping well.  He is not eating well.  He is having trouble concentrating.  He has noticed an increase in his worrying.  There is a family history of depression and anxiety.  Mother reports he had a full work-up at his pediatrics office that included a TSH just a few weeks ago and it was normal. ? ? ?  10/28/2021  ?  3:15 PM 12/09/2020  ?  3:38 PM 07/16/2020  ?  3:35 PM 09/24/2019  ?  1:31 PM 07/16/2019  ?  3:01 PM  ?Depression screen PHQ 2/9  ?Decreased Interest '2 1 1 ' 0 3  ?Down, Depressed, Hopeless 0 1 1 0 3  ?PHQ - 2 Score '2 2 2 ' 0 6  ?Altered sleeping '1 1 1 1 3  ' ?Tired, decreased energy '1 1 1 1 2  ' ?Change in appetite 0 2 0 0 3  ?Feeling bad or failure about yourself  2 1 0 0 1  ?Trouble concentrating 2 1 0 1 3  ?Moving slowly or fidgety/restless 0 1 0 0 0  ?Suicidal thoughts 0 0 0 0 0  ?PHQ-9 Score '8 9 4 3 18  ' ?Difficult doing work/chores    Somewhat difficult Not difficult at all  ? ? ?  10/28/2021  ?  3:17 PM 12/09/2020  ?  3:39  PM 07/16/2020  ?  3:37 PM 09/24/2019  ?  1:33 PM  ?GAD 7 : Generalized Anxiety Score  ?Nervous, Anxious, on Edge '2 2 1 1  ' ?Control/stop worrying 0 1 0 1  ?Worry too much - different things '2 1 1 3  ' ?Trouble relaxing 0 '1 1 1  ' ?Restless 0 1 0 0  ?Easily annoyed or irritable '2 2 1 3  ' ?Afraid - awful might happen 0 0 0 0  ?Total GAD 7 Score '6 8 4 9  ' ?Anxiety Difficulty    Very difficult  ? ?  ?  ?   ? View : No data to display.  ?  ?  ?  ? ? ? ?Immunization History  ?Administered Date(s) Administered  ? DTaP 01/23/2001, 04/06/2001, 06/05/2001, 02/28/2002, 12/07/2005  ? HPV Quadrivalent 12/24/2011, 09/27/2012, 02/13/2013  ? Hepatitis A 12/07/2005, 11/18/2006  ? Hepatitis B 01/01/01, 12/19/2000, 09/18/2001  ? HiB (PRP-OMP) 01/23/2001, 04/06/2001, 06/05/2001, 02/28/2002  ? IPV 01/23/2001, 04/06/2001, 11/22/2001, 12/07/2005  ?  Influenza,inj,Quad PF,6+ Mos 04/30/2019  ? MMR 11/22/2001, 12/07/2005  ? Meningococcal Conjugate 12/24/2011  ? PFIZER Comirnaty(Gray Top)Covid-19 Tri-Sucrose Vaccine 12/26/2019, 01/26/2020  ? Pneumococcal Conjugate-13 01/23/2001, 04/06/2001, 06/05/2001, 02/28/2002  ? Tdap 12/24/2011  ? Varicella 11/22/2001, 12/07/2005  ? ? ?No results found. ? ?Past Medical History:  ?Diagnosis Date  ? ADHD (attention deficit hyperactivity disorder)   ? Angio-edema   ? Asthma   ? Family history of adverse reaction to anesthesia   ? mom had am asthma flare up after waking up.   ? Fetomaternal hemorrhage 07/26/00  ? 41 wks term, fetomaternal hemmorhage s/p transfusion in NICU  ? Mesenteric adenitis   ? Mononucleosis 03/2018  ? Seasonal allergies   ? Urticaria   ? ?No Known Allergies ?Past Surgical History:  ?Procedure Laterality Date  ? CHOLECYSTECTOMY N/A 07/12/2019  ? Procedure: LAPAROSCOPIC CHOLECYSTECTOMY WITH ATTEMPTED INTRAOPERATIVE CHOLANGIOGRAM;  Surgeon: Coralie Keens, MD;  Location: [place];  Service: General;  Laterality: N/A;  ? ?Family History  ?Problem Relation Age of Onset  ? Allergic rhinitis Mother   ? Asthma Mother   ? Cancer Mother   ? Depression Mother   ? Alcohol abuse Father   ? Asthma Maternal Grandmother   ? Depression Maternal Grandmother   ? Hypertension Maternal Grandmother   ? Heart disease Maternal Grandfather   ? Colon cancer Paternal Grandmother   ? Alcohol abuse Paternal Grandfather   ? Pancreatitis Maternal Uncle   ?     sudden death at 16  ? ?Social History  ? ?Social History Narrative  ? Marital status/children/pets: Single, HS student.   ? Safety:   ?   -smoke alarm in the home:Yes  ?   - wears seatbelt: Yes  ?   - Feels safe in their relationships: Yes  ?   ? ? ?All past medical history, surgical history, allergies, family history, immunizations andmedications were updated in the EMR today and reviewed under the history and medication portions of their EMR.    ? ?No results found. ? ? ?ROS: 14  pt review of systems performed and negative (unless mentioned in an HPI) ? ?Objective: ?There were no vitals taken for this visit. ?Physical Exam ?Vitals and nursing note reviewed.  ?Constitutional:   ?   General: He is not in acute distress. ?   Appearance: Normal appearance. He is not ill-appearing, toxic-appearing or diaphoretic.  ?HENT:  ?   Head: Normocephalic and atraumatic.  ?Eyes:  ?   General:  No scleral icterus.    ?   Right eye: No discharge.     ?   Left eye: No discharge.  ?   Extraocular Movements: Extraocular movements intact.  ?   Pupils: Pupils are equal, round, and reactive to light.  ?Neurological:  ?   Mental Status: He is alert and oriented to person, place, and time. Mental status is at baseline.  ?Psychiatric:     ?   Mood and Affect: Mood normal.     ?   Behavior: Behavior normal.     ?   Thought Content: Thought content normal.     ?   Judgment: Judgment normal.  ? ? ? ?Assessment/plan: ?Astor Gentle is a 21 y.o. male present for est care ?Depression with anxiety ?Stable.  ?Continue Zoloft 100 mg daily ?Follow-up in 5.5 mos, sooner if needed ? ? ?No orders of the defined types were placed in this encounter. ? ? ?Meds ordered this encounter  ?Medications  ? sertraline (ZOLOFT) 100 MG tablet  ?  Sig: Take 1 tablet (100 mg total) by mouth daily.  ?  Dispense:  90 tablet  ?  Refill:  1  ? ? ? ? ?Note is dictated utilizing voice recognition software. Although note has been proof read prior to signing, occasional typographical errors still can be missed. If any questions arise, please do not hesitate to call for verification.  ?Electronically signed by: ?Howard Pouch, DO ?[place] ? ?

## 2022-02-01 ENCOUNTER — Encounter: Payer: Self-pay | Admitting: Family Medicine

## 2022-02-01 ENCOUNTER — Telehealth (INDEPENDENT_AMBULATORY_CARE_PROVIDER_SITE_OTHER): Payer: 59 | Admitting: Family Medicine

## 2022-02-01 VITALS — Wt 131.0 lb

## 2022-02-01 DIAGNOSIS — F418 Other specified anxiety disorders: Secondary | ICD-10-CM | POA: Diagnosis not present

## 2022-02-01 MED ORDER — SERTRALINE HCL 100 MG PO TABS: 100.0000 mg | ORAL_TABLET | Freq: Every day | ORAL | 3 refills | Status: DC

## 2022-02-01 NOTE — Patient Instructions (Signed)
No follow-ups on file.        Great to see you today.  I have refilled the medication(s) we provide.   If labs were collected, we will inform you of lab results once received either by echart message or telephone call.   - echart message- for normal results that have been seen by the patient already.   - telephone call: abnormal results or if patient has not viewed results in their echart.  

## 2022-02-01 NOTE — Progress Notes (Signed)
VIRTUAL VISIT VIA VIDEO  I connected with Adam Odonnell on 02/01/22 at  3:40 PM EDT by a video enabled telemedicine application and verified that I am speaking with the correct person using two identifiers. Location patient: Home Location provider: [facility], Office Persons participating in the virtual visit: Patient, Dr. Raoul Pitch and Cyndra Numbers, CMA  I discussed the limitations of evaluation and management by telemedicine and the availability of in person appointments. The patient expressed understanding and agreed to proceed.      Patient ID: Adam Odonnell, male  DOB: [birth year], 21 y.o.   MRN: [id number] Patient Care Team    Relationship Specialty Notifications Start End  Ma Hillock, DO PCP - General Family Medicine  07/16/19   Valentina Shaggy, MD Consulting Physician Allergy and Immunology  07/16/19   Arbutus Leas, PhD Consulting Physician Psychology  07/16/19     Chief Complaint  Patient presents with   Anxiety    [facility];     Subjective: Adam Odonnell is a 21 y.o.  male present for follow-up on depression and anxiety. Depression/anxiety:Adam Odonnell is a 21 y.o. male present for f/u on depression and anxiety. He reports he is working a steady job, and doing well. Feels the zoloft 100 mg is helping. He is moving to [place] this weekend and looks forward to the change. He is renting a town home there.  Prior note: Patient reports compliance Zoloft 75 mg daily.  He feels this medication works okay for him but he has noticed an increase in anxiety and thinks he could use a higher dose.  He left college and moved back home.  He did not feel the college was a good fit for him.  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 Prior note: Patient presents with his mother today to discuss feelings of depression and anxiety that started approximately 3 months ago.  Mother states his symptoms started when he  was positive for Covid.  She reports he had to stay in his room and isolate, since that time he has started to experience depression and anxiety.  He states he has lack of motivation for all tasks including schoolwork.  He is either sleeping too much and then not sleeping well.  He is not eating well.  He is having trouble concentrating.  He has noticed an increase in his worrying.  There is a family history of depression and anxiety.  Mother reports he had a full work-up at his pediatrics office that included a TSH just a few weeks ago and it was normal.     02/01/2022    3:41 PM 10/28/2021    3:15 PM 12/09/2020    3:38 PM 07/16/2020    3:35 PM 09/24/2019    1:31 PM  Depression screen PHQ 2/9  Decreased Interest 0 '2 1 1 ' 0  Down, Depressed, Hopeless 0 0 1 1 0  PHQ - 2 Score 0 '2 2 2 ' 0  Altered sleeping '3 1 1 1 1  ' Tired, decreased energy '1 1 1 1 1  ' Change in appetite 1 0 2 0 0  Feeling bad or failure about yourself  0 2 1 0 0  Trouble concentrating '1 2 1 ' 0 1  Moving slowly or fidgety/restless 0 0 1 0 0  Suicidal thoughts 0 0 0 0 0  PHQ-9 Score '6 8 9 4 3  ' Difficult doing work/chores  Somewhat difficult      02/01/2022    3:42 PM 10/28/2021    3:17 PM 12/09/2020    3:39 PM 07/16/2020    3:37 PM  GAD 7 : Generalized Anxiety Score  Nervous, Anxious, on Edge '2 2 2 1  ' Control/stop worrying 1 0 1 0  Worry too much - different things '1 2 1 1  ' Trouble relaxing 2 0 1 1  Restless 2 0 1 0  Easily annoyed or irritable '3 2 2 1  ' Afraid - awful might happen 0 0 0 0  Total GAD 7 Score '11 6 8 4           ' No data to display         Immunization History  Administered Date(s) Administered   DTaP 01/23/2001, 04/06/2001, 06/05/2001, 02/28/2002, 12/07/2005   HPV Quadrivalent 12/24/2011, 09/27/2012, 02/13/2013   Hepatitis A 12/07/2005, 11/18/2006   Hepatitis B 2001-06-14, 12/19/2000, 09/18/2001   HiB (PRP-OMP) 01/23/2001, 04/06/2001, 06/05/2001, 02/28/2002   IPV 01/23/2001, 04/06/2001, 11/22/2001,  12/07/2005   Influenza,inj,Quad PF,6+ Mos 04/30/2019   MMR 11/22/2001, 12/07/2005   Meningococcal Conjugate 12/24/2011   PFIZER Comirnaty(Gray Top)Covid-19 Tri-Sucrose Vaccine 12/26/2019, 01/26/2020   Pneumococcal Conjugate-13 01/23/2001, 04/06/2001, 06/05/2001, 02/28/2002   Tdap 12/24/2011   Varicella 11/22/2001, 12/07/2005    No results found.  Past Medical History:  Diagnosis Date   ADHD (attention deficit hyperactivity disorder)    Angio-edema    Asthma    Family history of adverse reaction to anesthesia    mom had am asthma flare up after waking up.    Fetomaternal hemorrhage 2000/07/15   41 wks term, fetomaternal hemmorhage s/p transfusion in NICU   Mesenteric adenitis    Mononucleosis 03/2018   Seasonal allergies    Urticaria    No Known Allergies Past Surgical History:  Procedure Laterality Date   CHOLECYSTECTOMY N/A 07/12/2019   Procedure: LAPAROSCOPIC CHOLECYSTECTOMY WITH ATTEMPTED INTRAOPERATIVE CHOLANGIOGRAM;  Surgeon: Coralie Keens, MD;  Location: [place];  Service: General;  Laterality: N/A;   Family History  Problem Relation Age of Onset   Allergic rhinitis Mother    Asthma Mother    Cancer Mother    Depression Mother    Alcohol abuse Father    Asthma Maternal Grandmother    Depression Maternal Grandmother    Hypertension Maternal Grandmother    Heart disease Maternal Grandfather    Colon cancer Paternal Grandmother    Alcohol abuse Paternal Grandfather    Pancreatitis Maternal Uncle        sudden death at 39   Social History   Social History Narrative   Marital status/children/pets: Single, HS student.    Safety:      -smoke alarm in the home:Yes     - wears seatbelt: Yes     - Feels safe in their relationships: Yes       All past medical history, surgical history, allergies, family history, immunizations andmedications were updated in the EMR today and reviewed under the history and medication portions of their EMR.     No results  found.   ROS: 14 pt review of systems performed and negative (unless mentioned in an HPI)  Objective: Wt 131 lb (59.4 kg)   BMI 18.80 kg/m  Physical Exam Vitals and nursing note reviewed.  Constitutional:      General: He is not in acute distress.    Appearance: Normal appearance. He is not ill-appearing, toxic-appearing or diaphoretic.  HENT:     Head: Normocephalic  and atraumatic.  Eyes:     General: No scleral icterus.       Right eye: No discharge.        Left eye: No discharge.     Extraocular Movements: Extraocular movements intact.     Pupils: Pupils are equal, round, and reactive to light.  Neurological:     Mental Status: He is alert and oriented to person, place, and time. Mental status is at baseline.  Psychiatric:        Mood and Affect: Mood normal.        Behavior: Behavior normal.        Thought Content: Thought content normal.        Judgment: Judgment normal.    Assessment/plan: Adam Odonnell is a 21 y.o. male present for  [facility] f/u Depression with anxiety Stable Continue Zoloft 100 mg daily Follow-up in 5.5 mos, sooner if needed   No orders of the defined types were placed in this encounter.   Meds ordered this encounter  Medications   sertraline (ZOLOFT) 100 MG tablet    Sig: Take 1 tablet (100 mg total) by mouth daily.    Dispense:  90 tablet    Refill:  3      Note is dictated utilizing voice recognition software. Although note has been proof read prior to signing, occasional typographical errors still can be missed. If any questions arise, please do not hesitate to call for verification.  Electronically signed by: Howard Pouch, DO [place]

## 2022-02-02 ENCOUNTER — Other Ambulatory Visit: Payer: Self-pay | Admitting: Allergy & Immunology

## 2022-02-02 ENCOUNTER — Ambulatory Visit (INDEPENDENT_AMBULATORY_CARE_PROVIDER_SITE_OTHER): Payer: 59 | Admitting: Allergy & Immunology

## 2022-02-02 ENCOUNTER — Encounter: Payer: Self-pay | Admitting: Allergy & Immunology

## 2022-02-02 DIAGNOSIS — J302 Other seasonal allergic rhinitis: Secondary | ICD-10-CM

## 2022-02-02 DIAGNOSIS — J4520 Mild intermittent asthma, uncomplicated: Secondary | ICD-10-CM

## 2022-02-02 DIAGNOSIS — J3089 Other allergic rhinitis: Secondary | ICD-10-CM | POA: Diagnosis not present

## 2022-02-02 MED ORDER — ALBUTEROL SULFATE HFA 108 (90 BASE) MCG/ACT IN AERS: 2.0000 | INHALATION_SPRAY | Freq: Four times a day (QID) | RESPIRATORY_TRACT | 1 refills | Status: DC | PRN

## 2022-02-02 NOTE — Telephone Encounter (Signed)
Rx changed to ins approved albuterol HFA.

## 2022-02-02 NOTE — Progress Notes (Signed)
RE: Adam Odonnell MRN: [id number] DOB: 02-Apr-2001 Date of Telemedicine Visit: 02/02/2022  Referring provider: Natalia Leatherwood, DO Primary care provider: Natalia Leatherwood, DO  Chief Complaint: Medication Management   Telemedicine Follow Up Visit via Telephone: I connected with Adam Odonnell for a follow up on 02/02/22 by telephone and verified that I am speaking with the correct person using two identifiers.   I discussed the limitations, risks, security and privacy concerns of performing an evaluation and management service by telephone and the availability of in person appointments. I also discussed with the patient that there may be a patient responsible charge related to this service. The patient expressed understanding and agreed to proceed.  Patient is at home accompanied by his mother who provided/contributed to the history.  Provider is at the office.  Visit start time: 11:58 AM Visit end time: 12:20 PM Insurance consent/check in by: Dr. Reece Agar Medical consent and medical assistant/nurse: Dr. Reece Agar  History of Present Illness:  He is a 21 y.o. male, who is being followed for seasonal and perennial allergic rhinitis as well as intermittent asthma. His previous allergy office visit with me was in March 2020, although he was seen in 2021 by one of my colleagues for sinus infection.  At that visit, in 2020, we added on Allegra.  We stopped Zyrtec.  We also started Grastek 1 tablet daily.  I did recommend increasing Allegra to twice daily when [place] was [place] the [place].  His lung function looked normal.  We continue with Singulair as well as albuterol.  Since last visit, he has mostly done well.  Asthma/Respiratory Symptom History: Asthma has been under good control.  He never took controller medication.  He is on albuterol to use as needed.  He last used it around 2 to 3 months ago.  He has not needed prednisone since last visit. He does not have any nighttime coughing at all.   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  He has been able to participate in golf games, but not mowing the grass has made his life a lot easier.  Allergic Rhinitis Symptom History: Allergic rhinitis symptoms are under good control with the Zyrtec every day.  He does not use the nose spray on a routine basis.  He last needed antibiotics when I sent them in sometime in late 2021.  Otherwise, he does not get sinus infections frequently.  He continues with Zoloft 100 mg daily.  He has been on this dose for about a year.  This is managed by his PCP, Dr. Claiborne Billings.  He last saw her yesterday.  He was working on [employer].  Collins just was not for him, at least the first time he tried it.  He is moving to [facility] and needs a refill on his albuterol.  He is going to be living with a friend and he may get back to school in the next year or so.  Otherwise, there have been no changes to his past medical history, surgical history, family history, or social history.  Assessment and Plan:  Adam Odonnell is a 21 y.o. male with:  Seasonal and perennial allergic rhinitis (trees, weeds, grasses, indoor molds, outdoor molds, dust mites and cat) - well controlled with avoidance of grass   Intermittent asthma, uncomplicated   Moving to [facility]   I am going to send in a refill for his albuterol.  It seems  that his albuterol typically expires before he finishes it.  He just does not want to be in [place] and have a flare and end up needing something.  I do not think we need to add a controller medication at this point in time, although we can certainly revisit that in the future if needed.  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  This seems to have helped everything quite a bit.  Diagnostics: None.  Medication List:  Current Outpatient Medications  Medication Sig Dispense Refill    albuterol (VENTOLIN HFA) 108 (90 Base) MCG/ACT inhaler Inhale 2 puffs into the lungs every 6 (six) hours as needed for wheezing or shortness of breath. 2 each 1   cetirizine (ZYRTEC) 10 MG chewable tablet Chew 10 mg by mouth daily.     sertraline (ZOLOFT) 100 MG tablet Take 1 tablet (100 mg total) by mouth daily. 90 tablet 3   No current facility-administered medications for this visit.   Allergies: No Known Allergies I reviewed his past medical history, social history, family history, and environmental history and no significant changes have been reported from previous visits.  Review of Systems  Constitutional: Negative.  Negative for fever.  HENT: Negative.  Negative for congestion, ear discharge and ear pain.   Eyes:  Negative for pain, discharge and redness.  Respiratory:  Negative for cough, shortness of breath and wheezing.   Cardiovascular: Negative.  Negative for chest pain and palpitations.  Gastrointestinal:  Negative for abdominal pain.  Endocrine: Negative for cold intolerance and heat intolerance.  Skin: Negative.  Negative for rash.  Allergic/Immunologic: Positive for environmental allergies. Negative for food allergies.  Neurological:  Negative for dizziness and headaches.  Hematological:  Does not bruise/bleed easily.    Objective:  Physical exam not obtained as encounter was done via telephone.   Previous notes and tests were reviewed.  I discussed the assessment and treatment plan with the patient. The patient was provided an opportunity to ask questions and all were answered. The patient agreed with the plan and demonstrated an understanding of the instructions.   The patient was advised to call back or seek an in-person evaluation if the symptoms worsen or if the condition fails to improve as anticipated.  I provided 22 minutes of non-face-to-face time during this encounter.  It was my pleasure to participate in [place]'s care today. Please feel  free to contact me with any questions or concerns.   Sincerely,  Alfonse Spruce, MD

## 2022-07-02 ENCOUNTER — Telehealth: Payer: Self-pay | Admitting: Allergy & Immunology

## 2022-07-02 MED ORDER — PREDNISONE 10 MG PO TABS: ORAL_TABLET | ORAL | 0 refills | Status: AC

## 2022-07-02 MED ORDER — HYDROXYZINE HCL 25 MG PO TABS: 25.0000 mg | ORAL_TABLET | Freq: Three times a day (TID) | ORAL | 0 refills | Status: AC | PRN

## 2022-07-02 NOTE — Telephone Encounter (Signed)
Patient's mother called reporting a new onset intense itching for 3 days duration. It is not responding to OTC antihistamines, even at BID dosing. He is not having anything like urticaria at all. This all started when he showed up at his Dad's home. There are no new exposures at all. He does not have jaundice or any known history of liver issues. We are unsure of the trigger at this point, but he is breathing fine and otherwise has no systemic symptoms.   I am going to send in a prednisone taper. I also am sending in hydroxyzine to use every 8 hours as needed.   Salvatore Marvel, MD [facility]

## 2022-09-02 ENCOUNTER — Other Ambulatory Visit: Payer: Self-pay

## 2022-10-04 ENCOUNTER — Other Ambulatory Visit: Payer: Self-pay

## 2023-05-11 ENCOUNTER — Encounter: Payer: Self-pay | Admitting: Family Medicine

## 2023-05-12 MED ORDER — SERTRALINE HCL 100 MG PO TABS: 100.0000 mg | ORAL_TABLET | Freq: Every day | ORAL | 0 refills | Status: AC

## 2023-05-13 NOTE — Telephone Encounter (Signed)
No further action needed.

## 2023-06-03 ENCOUNTER — Other Ambulatory Visit: Payer: Self-pay | Admitting: Family Medicine
